# Patient Record
Sex: Female | Born: 1985 | Race: White | Hispanic: No | Marital: Single | Smoking: Never smoker
Health system: Southern US, Community
[De-identification: ages and names within clinical notes are randomized; demographics above are authoritative.]

## PROBLEM LIST (undated history)

## (undated) DIAGNOSIS — F419 Anxiety disorder, unspecified: Secondary | ICD-10-CM

## (undated) HISTORY — PX: WISDOM TOOTH EXTRACTION: SHX21

## (undated) HISTORY — DX: Anxiety disorder, unspecified: F41.9

---

## 2007-06-03 ENCOUNTER — Emergency Department (HOSPITAL_COMMUNITY): Admission: EM | Admit: 2007-06-03 | Discharge: 2007-06-03 | Payer: Self-pay | Admitting: Emergency Medicine

## 2007-06-09 ENCOUNTER — Ambulatory Visit: Payer: Self-pay | Admitting: Family Medicine

## 2007-06-29 ENCOUNTER — Ambulatory Visit: Payer: Self-pay | Admitting: Family Medicine

## 2007-07-27 ENCOUNTER — Ambulatory Visit: Payer: Self-pay | Admitting: Family Medicine

## 2007-08-31 ENCOUNTER — Ambulatory Visit: Payer: Self-pay | Admitting: Family Medicine

## 2008-02-22 ENCOUNTER — Ambulatory Visit: Payer: Self-pay | Admitting: Family Medicine
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
## 2008-03-27 ENCOUNTER — Emergency Department (HOSPITAL_COMMUNITY): Admission: EM | Admit: 2008-03-27 | Discharge: 2008-03-27 | Payer: Self-pay | Admitting: Family Medicine

## 2009-02-17 ENCOUNTER — Emergency Department (HOSPITAL_COMMUNITY): Admission: EM | Admit: 2009-02-17 | Discharge: 2009-02-17 | Payer: Self-pay | Admitting: Emergency Medicine

## 2009-04-11 ENCOUNTER — Emergency Department (HOSPITAL_COMMUNITY): Admission: EM | Admit: 2009-04-11 | Discharge: 2009-04-11 | Payer: Self-pay | Admitting: Family Medicine

## 2009-11-06 ENCOUNTER — Emergency Department (HOSPITAL_COMMUNITY): Admission: EM | Admit: 2009-11-06 | Discharge: 2009-11-06 | Payer: Self-pay | Admitting: Emergency Medicine

## 2011-01-14 LAB — URINALYSIS, ROUTINE W REFLEX MICROSCOPIC
Bilirubin Urine: NEGATIVE
Glucose, UA: NEGATIVE mg/dL
Ketones, ur: NEGATIVE mg/dL
Leukocytes, UA: NEGATIVE
Protein, ur: NEGATIVE mg/dL
pH: 5 (ref 5.0–8.0)

## 2011-01-14 LAB — DIFFERENTIAL
Basophils Absolute: 0 10*3/uL (ref 0.0–0.1)
Lymphocytes Relative: 11 % — ABNORMAL LOW (ref 12–46)
Lymphs Abs: 1.1 10*3/uL (ref 0.7–4.0)
Monocytes Absolute: 0.6 10*3/uL (ref 0.1–1.0)
Neutro Abs: 8.8 10*3/uL — ABNORMAL HIGH (ref 1.7–7.7)

## 2011-01-14 LAB — GC/CHLAMYDIA PROBE AMP, URINE

## 2011-01-14 LAB — BASIC METABOLIC PANEL
Calcium: 9.1 mg/dL (ref 8.4–10.5)
GFR calc Af Amer: >60 mL/min (ref >60)
GFR calc non Af Amer: >60 mL/min (ref >60)
Glucose, Bld: 112 mg/dL — ABNORMAL HIGH (ref 70–99)
Sodium: 137 mEq/L (ref 135–145)

## 2011-01-14 LAB — URINE MICROSCOPIC-ADD ON

## 2011-01-14 LAB — WET PREP, GENITAL
Trich, Wet Prep: NONE SEEN
Yeast Wet Prep HPF POC: NONE SEEN

## 2011-01-14 LAB — PREGNANCY, URINE: Preg Test, Ur: NEGATIVE

## 2011-01-14 LAB — CBC
Hemoglobin: 13.8 g/dL (ref 12.0–15.0)
RDW: 12.4 % (ref 11.5–15.5)
WBC: 10.6 10*3/uL — ABNORMAL HIGH (ref 4.0–10.5)

## 2011-10-17 ENCOUNTER — Ambulatory Visit (INDEPENDENT_AMBULATORY_CARE_PROVIDER_SITE_OTHER): Payer: BC Managed Care – PPO | Admitting: Gynecology

## 2011-10-17 ENCOUNTER — Encounter: Payer: Self-pay | Admitting: Gynecology

## 2011-10-17 VITALS — BP 120/82 | Ht 63.0 in | Wt 124.0 lb

## 2011-10-17 DIAGNOSIS — N870 Mild cervical dysplasia: Secondary | ICD-10-CM

## 2011-10-17 NOTE — Progress Notes (Signed)
Patient presents to office today as a result of recent abnormal pap smear in [place], [place]. Results: LGSIL. No prior abnormal pap. Complete STD screen negative recently. On [facility] OCP.  Physical Exam  Genitourinary:     detail colposcopic examination was undertaken. The external genitalia perineum and perirectal region with no gross lesions noted oral colposcopic review. Vagina and fornices and cervix with no lesions noted even after application of acetic acid. Endocervical speculum was utilized transformation zone was visualized entirely an ECC was obtained. An ECC was benign I discussed with the patient and she'll be followed up with her Pap smear in 6 months. New guidelines provided as well.

## 2011-10-17 NOTE — Patient Instructions (Signed)
Patient information: Pap tests (The Basics)  What is a Pap test? - A Pap test (sometimes called a "Pap smear") is a test that doctors use to check the cervix for early signs of cancer. The cervix is the part of a woman's body where the uterus and the vagina meet. It is the bottom part of the uterus .  To do a Pap test, your doctor or nurse will push apart the walls of your vagina using a device that looks like a duck beak (called a speculum). Then, he or she will use a small tool to collect cells from your cervix. The staff at a lab will look at those cells under a microscope to see if they are abnormal.  Do not assume that you are having a Pap test every time the doctor or nurse uses a speculum. That device-the one that looks like a duck beak-is used for other reasons, too. If the doctor or nurses uses a speculum, ask whether you are being checked for cervical cancer.  Pap tests can find cancer cells or cells that could turn into cancer, called "precancer." The test can usually find cancer in the early stages, when it can be treated or even cured.  When should a woman start having Pap tests? - Women should start having Pap tests when they turn 21. They do not need to be sexually active before they start getting Pap tests. When they turn 30, their doctors might also suggest doing another test to check for cervical cancer, called an HPV test.  What should I do to prepare for a Pap test? - You do not need to do anything special to prepare for a pap test. Women sometimes hear that they should not have sex or put anything in their vagina for 2 days before a pap test, but it turns out that recommendation is not necessary. Pap tests work fine even in women who have had sex recently or who have used vaginal lubricants or creams. Do not worry if you have your period on the day of the test. It can still be done even if you are bleeding. If your bleeding is very heavy, you may want to call  your doctor or nurse to see if you need to reschedule. How often should a woman have a Pap test? - That depends on how old she is and what the results of her past Pap tests have been.  Women age 70 to 31 should have a Pap test every 3 years.  Women age 78 and older can have a Pap test every 3 years or a Pap test and HPV test every 5 years.  Women age 42 and older should stop having Pap tests if they meet all 3 of these requirements: They had Pap tests done regularly until they turned 65.  They had 3 normal Pap tests in a row.  They had no abnormal Pap tests in the past 10 years. Do I need to get Pap tests if I had a hysterectomy? - If you had surgery called a "hysterectomy" to remove your uterus, ask your doctor if you need to keep having Pap tests. If you no longer have a cervix, and if you did not have cervical cancer before your hysterectomy, you most likely do not need to have Pap testing after surgery.  Do I need to get Pap tests if I had the HPV vaccine? - Yes. You still need to get Pap tests if you got the HPV vaccine.  HPV stands for "human papillomavirus." It is the virus that causes cervical cancer. Getting the HPV vaccine reduces your chances of getting cervical cancer. But it does not completely protect you. You still need to be checked for cancer.  What if I have an abnormal Pap test? - First, you should know that abnormal Pap tests are common. They are just an initial test, and most women with an abnormal Pap test do not have cancer. If your Pap test has cells that look "abnormal," your doctor or nurse can follow up with another test to find out for sure what is going on. Depending on the result of your Pap test, your doctor or nurse might order an HPV test done on the sample that he or she collected for the Pap test. The HPV test checks for infection with a type of HPV virus that can cause cancer. If you do not have this type of HPV virus, you probably do not have cancer.  Your doctor might  also suggest that you have another Pap test or a test called a colposcopy. These are explained below:  Another Pap test at 12 months - Sometimes a Pap test shows cells that could be either normal or abnormal. If you wait a few months and have another Pap test, you could find that the cells are back to normal.  A colposcopy - For this test, the doctor or nurse looks at your cervix using a device that looks like a microscope. It allows the doctor or nurse to see the cervix in fine detail. During this test, the doctor or nurse might also take tiny samples of tissue from the cervix. This is called a "biopsy." Tissue from the biopsy can go to the lab and be checked for anything   Screening methods for cervical cancer: Joint Recommendations of the American Cancer Society, the American Society ofColposcopy and Cervical Pathology (ASCCP), and the American Society for Clinical Pathology:  Women's age 34-29 years should be tested with cervical cytology alone, and screening should be performed every 3 years. Co-testing should not be performed in women younger than 27 years of age. For women age 24-106 years of age, co-testing with cytology and HPV testing every 5 years is preferred. Screening with cytology alone every 3 years is acceptable.    LOW GRADE LESIONS: CIN 1 General approach - Cervical intraepithelial lesion (CIN) 1 is a low-grade lesion caused by human papillomavirus (HPV) subtypes of both low and high oncogenic risk. (See "Cervical intraepithelial neoplasia: Definition, incidence, and pathogenesis".) Overall, CIN 1 lesions will regress in most women. As an example, a retrospective study of 67 women with CIN 1 followed for one year found the following: 49 percent regressed to negative cytology or histology; 18 percent had persistent low grade cytology or histology; and 10 were subsequently diagnosed with CIN 2,3 [17].  The management of women with CIN 1 depends upon the preceding cytology. When CIN 1 is  preceded by low-grade cytologic findings (atypical squamous cells of undetermined significance [ASC-US], atypical squamous cells-cannot exclude high-grade squamous intraepithelial lesion [ASC-H], and low-grade squamous intraepithelial lesion [LSIL]), treatment with excisional biopsy is not advised for most women [1]. The exceptions to this are women with CIN 1 that persists for at least two years, for whom either continued surveillance or treatment is acceptable. However, for women with persistent CIN 1 in the setting of an unsatisfactory colposcopy, treatment is preferred (algorithm 1).  The majority of CIN 1 preceded by low-grade cervical cytology will  not progress to high-grade disease. Studies have reported that 4 to 13 percent of women with CIN 1 preceded by low-grade histology will be diagnosed with CIN 2,3 within 6 to 24 months of follow-up [18-20]. No studies have reported invasive cervical cancer in this patient population within this follow-up period.  Women with CIN on biopsy preceded by a cytologic finding of high-grade SIL (HSIL) or atypical glandular cells-not otherwise specified (AGC-NOS) have a high risk of high-grade preinvasive disease or invasive cancer. Management options for these women include: diagnostic excisional procedure; review of previous cytology, colposcopic findings, and histology; or observation with colposcopy and cytology, provided colposcopy is satisfactory and endocervical sampling is negative (algorithm 2). The evaluation of women with glandular abnormalities on cervical cytology is discussed in detail separately. (See "Cervical cytology: Evaluation of atypical and malignant glandular cells".) In general, women with HSIL cytology have a greater than 70 percent prevalence of underlying CIN 2,3 or worse. Similarly, women with AGC have a high prevalence of underlying CIN 2,3, adenocarcinoma in situ, or cancer. (See "Cervical cytology: Evaluation of high grade squamous  intraepithelial lesions" and "Cervical cytology: Evaluation of atypical and malignant glandular cells", section on 'Risk of premalignant or malignant disease'.) There are few data regarding how many women with a CIN 1 biopsy preceded by HSIL or AGC cytology will develop high grade preinvasive disease or cancer. However, based upon the higher prevalence of clinically significant disease in these women, the likelihood of missing a high grade lesion upon initial colposcopy is higher in women with HSIL or AGC cytology compared with those with low grade cytology. Several investigators have attempted to predict the risk of progression or regression on the basis of HLA genotype [21], PTEN expression [22], and many other biomarkers [23,24]. With the exception of follow-up HPV testing [25], no reliable and reproducible paradigm has been established to triage patients with CIN 1 on the basis of such predictors. Use of HPV testing for cervical cancer screening is discussed separately. (See "Screening for cervical cancer: Rationale and recommendations", section on 'HPV testing'.) CIN 1 preceded by low grade cytological abnormalities Expectant management - Since spontaneous regression is observed in most women in this setting (ASC-US, ASC-H, LSIL), expectant management is generally preferred for the reliable patient with biopsy-confirmed CIN 1, in whom the entire lesion and limits of the transformation zone are completely visualized (ie, satisfactory colposcopic examination). Approximately 80 to 85 percent of women referred for initial colposcopy because of LSIL or HPV DNA positive ASC-US have CIN 1 or less detected on biopsy [18,26]. Overall, 9 to 16 percent of these women will have histologically confirmed CIN 2 or 3 within two years of follow-up. Fewer women with ASC-H have only CIN 1 or less detected at initial biopsy, but for those who do, the risk of subsequent progression to CIN 2,3 is 11 percent [27]. Therefore, an  effective method for monitoring patients managed expectantly is important. The only randomized trial evaluating approaches to postcolposcopy monitoring of these patients concluded that HPV testing after 12 months with repeat colposcopic referral if the HPV results are positive for high risk types was more efficient than semiannual cytology or combined cytology and high risk HPV DNA testing [26]. Sensitivity to detect subsequent CIN 2,3 for a single HPV DNA test at 12 months was 92 percent, with a 55 percent rate of colposcopy referral. To achieve comparable sensitivity with the other follow-up methods, more visits would be required and with a higher rate of colposcopy referral, 64 to  70 percent. Based on these data, the American Society for Colposcopy and Cervical Pathology (ASCCP) consensus guidelines recommend expectant management of women with biopsy confirmed CIN 1 using follow-up with HPV testing at 12 months or repeat cytology at 6 and 12 months [1]. In addition: Colposcopy should be repeated if repeat cytology shows ASC or greater or HPV DNA testing is positive for a high risk type.  After two negative smears or a negative HPV DNA test, routine screening may be resumed. Follow-up of women with CIN 1 beyond 24 months has shown that spontaneous regression or progression can occur [28]. There are no data to suggest that it is unsafe to continue close clinical follow-up of a compliant patient with persistent CIN 1, with treatment planned if there is evidence of disease progression or if the women chooses to be treated.

## 2011-10-21 ENCOUNTER — Encounter: Payer: Self-pay | Admitting: Gynecology

## 2011-10-23 ENCOUNTER — Telehealth: Payer: Self-pay | Admitting: *Deleted

## 2011-10-23 NOTE — Telephone Encounter (Signed)
Pt mother(Deborah) called regarding pt recent results from C&B. 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 Mother is concerned about this she has numerous question about her daughter results. She lives in [place] and can't come to [place] right away. She would like to speak with you if possible. Please advise

## 2011-10-24 NOTE — Telephone Encounter (Signed)
Got mother on phone for JF to speak with her.

## 2012-04-12 ENCOUNTER — Ambulatory Visit: Payer: BC Managed Care – PPO | Admitting: Gynecology

## 2012-04-13 ENCOUNTER — Encounter: Payer: Self-pay | Admitting: Gynecology

## 2012-04-13 ENCOUNTER — Other Ambulatory Visit (HOSPITAL_COMMUNITY)
Admission: RE | Admit: 2012-04-13 | Discharge: 2012-04-13 | Disposition: A | Discharge status: Home / Self Care | Payer: BC Managed Care – PPO | Source: Ambulatory Visit | Attending: Gynecology | Admitting: Gynecology

## 2012-04-13 ENCOUNTER — Ambulatory Visit (INDEPENDENT_AMBULATORY_CARE_PROVIDER_SITE_OTHER): Payer: BC Managed Care – PPO | Admitting: Gynecology

## 2012-04-13 VITALS — BP 108/68

## 2012-04-13 DIAGNOSIS — Z1159 Encounter for screening for other viral diseases: Secondary | ICD-10-CM | POA: Insufficient documentation

## 2012-04-13 DIAGNOSIS — N870 Mild cervical dysplasia: Secondary | ICD-10-CM

## 2012-04-13 DIAGNOSIS — Z01419 Encounter for gynecological examination (general) (routine) without abnormal findings: Secondary | ICD-10-CM | POA: Insufficient documentation

## 2012-04-13 MED ORDER — DROSPIREN-ETH ESTRAD-LEVOMEFOL 3-0.02-0.451 MG PO TABS: 1.0000 | ORAL_TABLET | Freq: Every day | ORAL | Status: DC

## 2012-04-13 NOTE — Progress Notes (Signed)
Patient is a 26 year old who was seen the office as a new patient on 10/17/2011. She had stated that she had an abnormal Pap smear in [facility] whereby she had low-grade squamous intraepithelial lesion on a prior colposcopy and biopsy. There was never any mention of her having been tested for the HPV virus to her prior Pap smear. 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 - BENIGN ENDOCERVICAL MUCOSA.  Patient presented to the office today for followup Pap smear. We discussed the new guidelines. If this Pap smear continues to show low-grade squamous intraepithelial lesion will followup with Pap smear in 6 months when she is due for her annual exam and then continue to follow yearly unless she has a higher grade lesion at that time then she will need to be reevaluated with colposcopy if indicated. The Pap smear with co-testing was obtained today. Patient was given prescription refill for oral contraceptive pill as per her request.

## 2012-04-21 ENCOUNTER — Telehealth: Payer: Self-pay | Admitting: *Deleted

## 2012-04-21 NOTE — Telephone Encounter (Signed)
Patient had lm for results.  Lm for patient that pap was normal.

## 2012-06-21 ENCOUNTER — Encounter: Payer: Self-pay | Admitting: *Deleted

## 2012-06-21 NOTE — Progress Notes (Signed)
Patient ID: Natasha Pugh, female   DOB: [birth year], 26 y.o.   MRN: [id number] Left message on pt voicemail, pt had question about pap results?

## 2012-12-09 ENCOUNTER — Other Ambulatory Visit (HOSPITAL_COMMUNITY)
Admission: RE | Admit: 2012-12-09 | Discharge: 2012-12-09 | Disposition: A | Discharge status: Home / Self Care | Payer: BC Managed Care – PPO | Source: Ambulatory Visit | Attending: Gynecology | Admitting: Gynecology

## 2012-12-09 ENCOUNTER — Encounter: Payer: Self-pay | Admitting: Gynecology

## 2012-12-09 ENCOUNTER — Ambulatory Visit (INDEPENDENT_AMBULATORY_CARE_PROVIDER_SITE_OTHER): Payer: BC Managed Care – PPO | Admitting: Gynecology

## 2012-12-09 VITALS — BP 114/68 | Ht 63.25 in | Wt 124.0 lb

## 2012-12-09 DIAGNOSIS — Z23 Encounter for immunization: Secondary | ICD-10-CM

## 2012-12-09 DIAGNOSIS — Z01419 Encounter for gynecological examination (general) (routine) without abnormal findings: Secondary | ICD-10-CM

## 2012-12-09 LAB — CBC WITH DIFFERENTIAL/PLATELET
Basophils Absolute: 0 10*3/uL (ref 0.0–0.1)
Basophils Relative: 1 % (ref 0–1)
Eosinophils Absolute: 0.1 10*3/uL (ref 0.0–0.7)
Eosinophils Relative: 1 % (ref 0–5)
HCT: 37.7 % (ref 36.0–46.0)
MCHC: 34.5 g/dL (ref 30.0–36.0)
MCV: 82 fL (ref 78.0–100.0)
Monocytes Absolute: 0.5 10*3/uL (ref 0.1–1.0)
RDW: 13.2 % (ref 11.5–15.5)

## 2012-12-09 MED ORDER — DROSPIREN-ETH ESTRAD-LEVOMEFOL 3-0.02-0.451 MG PO TABS: 1.0000 | ORAL_TABLET | Freq: Every day | ORAL | Status: DC

## 2012-12-09 NOTE — Patient Instructions (Addendum)

## 2012-12-09 NOTE — Progress Notes (Signed)
Natasha Pugh 1986/05/18 [id number]   History:    27 y.o.  for annual gyn exam with no complaints today. Review of patient's records indicated that patient had an abnormal Pap smear in January 2013 (low-grade squamous intraepithelial lesion). Subsequently she underwent colposcopic directed biopsy which confirmed CIN-1. Followup Pap smear in July 2013 was negative. Patient is on Beyaz oral contraceptive pill. Patient frequently does her self breast examination. Patient would know prior history of Tdap. Patient reports normal menstrual cycles.  Past medical history,surgical history, family history and social history were all reviewed and documented in the EPIC chart.  Gynecologic History Patient's last menstrual period was 11/25/2012. Contraception: OCP (estrogen/progesterone) Last Pap: 2013. Results were: normal Last mammogram:not indicated. Results were: not indicated  Obstetric History OB History   Grav Para Term Preterm Abortions TAB SAB Ect Mult Living   0                ROS: A ROS was performed and pertinent positives and negatives are included in the history.  GENERAL: No fevers or chills. HEENT: No change in vision, no earache, sore throat or sinus congestion. NECK: No pain or stiffness. CARDIOVASCULAR: No chest pain or pressure. No palpitations. PULMONARY: No shortness of breath, cough or wheeze. GASTROINTESTINAL: No abdominal pain, nausea, vomiting or diarrhea, melena or bright red blood per rectum. GENITOURINARY: No urinary frequency, urgency, hesitancy or dysuria. MUSCULOSKELETAL: No joint or muscle pain, no back pain, no recent trauma. DERMATOLOGIC: No rash, no itching, no lesions. ENDOCRINE: No polyuria, polydipsia, no heat or cold intolerance. No recent change in weight. HEMATOLOGICAL: No anemia or easy bruising or bleeding. NEUROLOGIC: No headache, seizures, numbness, tingling or weakness. PSYCHIATRIC: No depression, no loss of interest in normal activity or change in sleep  pattern.     Exam: chaperone present  BP 114/68  Ht 5' 3.25" (1.607 m)  Wt 124 lb (56.246 kg)  BMI 21.78 kg/m2  LMP 11/25/2012  Body mass index is 21.78 kg/(m^2).  General appearance : Well developed well nourished female. No acute distress HEENT: Neck supple, trachea midline, no carotid bruits, no thyroidmegaly Lungs: Clear to auscultation, no rhonchi or wheezes, or rib retractions  Heart: Regular rate and rhythm, no murmurs or gallops Breast:Examined in sitting and supine position were symmetrical in appearance, no palpable masses or tenderness,  no skin retraction, no nipple inversion, no nipple discharge, no skin discoloration, no axillary or supraclavicular lymphadenopathy Abdomen: no palpable masses or tenderness, no rebound or guarding Extremities: no edema or skin discoloration or tenderness  Pelvic:  Bartholin, Urethra, Skene Glands: Within normal limits             Vagina: No gross lesions or discharge  Cervix: No gross lesions or discharge  Uterus  anteverted, normal size, shape and consistency, non-tender and mobile  Adnexa  Without masses or tenderness  Anus and perineum  normal   Rectovaginal  normal sphincter tone without palpated masses or tenderness             Hemoccult that indicated     Assessment/Plan:  27 y.o. female for annual exam who will be moving to [place] in a few months. Patient doing well otherwise. Pap smear done today along with CBC and urinalysis. Patient received a Tdap vaccine today. She was reminded do her monthly self breast examination. She was given a prescription of Beyaz oral contraceptive pill and we had a discussion on the progesterone component of this oral contraceptive pill (Drospirenone). I've explained to  the patient that there been reported cases of DVT and pulmonary embolism on patient's on this type of oral contraceptive pill although the risk is low it is still there. Patient fully understands and states that this birth control  pill has worked the best for her and she accepts the risk. She is with normal weight and denies smoking and denies any family history of any clotting or bleeding disorders.    Ok Edwards MD, 5:07 PM 12/09/2012

## 2013-11-08 ENCOUNTER — Telehealth: Payer: Self-pay

## 2013-11-08 MED ORDER — DROSPIRENONE-ETHINYL ESTRADIOL 3-0.03 MG PO TABS: 1.0000 | ORAL_TABLET | Freq: Every day | ORAL | Status: AC

## 2013-11-08 NOTE — Telephone Encounter (Signed)
Patient informed. That Rx will be sent in for 3 mos supply.  She is due her CE in March so important that she establish with a physician there in [place] to get her Rx continued.

## 2013-11-08 NOTE — Telephone Encounter (Signed)
Patient has moved to [place].  She went to refill Natasha Pugh and discovered that with the new year her [employer] ins no longer covers her [place].  She is asking if you could prescribe the generic version or another generic OC so that it will cover it.

## 2013-11-08 NOTE — Telephone Encounter (Signed)
Tell her that I do not have a medical life since infancy. You can call her and prescription for Yasmin one month supply with 2 refills until she gets establish in [place] if they will except this  prescription.
